# Patient Record
Sex: Female | Born: 2005 | Race: White | Hispanic: No | Marital: Single | Smoking: Never smoker
Health system: Southern US, Community
[De-identification: ages and names within clinical notes are randomized; demographics above are authoritative.]

## PROBLEM LIST (undated history)

## (undated) DIAGNOSIS — D649 Anemia, unspecified: Secondary | ICD-10-CM

## (undated) HISTORY — DX: Anemia, unspecified: D64.9

## (undated) HISTORY — PX: TYMPANOSTOMY TUBE PLACEMENT: SHX32

---
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
## 2024-01-30 ENCOUNTER — Encounter (HOSPITAL_BASED_OUTPATIENT_CLINIC_OR_DEPARTMENT_OTHER): Payer: Self-pay | Admitting: Emergency Medicine

## 2024-01-30 ENCOUNTER — Other Ambulatory Visit: Payer: Self-pay

## 2024-01-30 DIAGNOSIS — S4991XA Unspecified injury of right shoulder and upper arm, initial encounter: Secondary | ICD-10-CM | POA: Diagnosis present

## 2024-01-30 DIAGNOSIS — W228XXA Striking against or struck by other objects, initial encounter: Secondary | ICD-10-CM | POA: Insufficient documentation

## 2024-01-30 MED ORDER — ACETAMINOPHEN 325 MG PO TABS
650.0000 mg | ORAL_TABLET | Freq: Once | ORAL | Status: AC
  Administered 2024-01-30: 650 mg via ORAL
  Filled 2024-01-30: qty 2

## 2024-01-30 NOTE — ED Triage Notes (Signed)
  Patient comes in with R elbow and wrist pain that occurred about an hour ago after hitting arm on wall.  Patient was playing tik tok game and hit a wall full force with R arm.  No obvious deformity.  Pain 7/10, throbbing.

## 2024-01-31 ENCOUNTER — Emergency Department (HOSPITAL_BASED_OUTPATIENT_CLINIC_OR_DEPARTMENT_OTHER): Admitting: Radiology

## 2024-01-31 ENCOUNTER — Emergency Department (HOSPITAL_BASED_OUTPATIENT_CLINIC_OR_DEPARTMENT_OTHER)
Admission: EM | Admit: 2024-01-31 | Discharge: 2024-01-31 | Disposition: A | Discharge status: Home / Self Care | Attending: Emergency Medicine | Admitting: Emergency Medicine

## 2024-01-31 DIAGNOSIS — S4991XA Unspecified injury of right shoulder and upper arm, initial encounter: Secondary | ICD-10-CM

## 2024-01-31 NOTE — ED Provider Notes (Signed)
 [place] EMERGENCY DEPARTMENT AT [facility] Provider Note   CSN: [id number] Arrival date & time: 01/30/24  2259     History Chief Complaint  Patient presents with   Arm Injury    HPI Kathleen Ford is a 18 y.o. female presenting for right arm pain.  She was spinning in a circle with blindfold on as part of a game and her arms leaned into the corner of the wall.  Impact was just distal to the elbow.  Substantial pain at the site.  Difficulty moving her elbow.  Neurovascularly intact.  Otherwise healthy.   Patient's recorded medical, surgical, social, medication list and allergies were reviewed in the Snapshot window as part of the initial history.   Review of Systems   Review of Systems  Constitutional:  Negative for chills and fever.  HENT:  Negative for ear pain and sore throat.   Eyes:  Negative for pain and visual disturbance.  Respiratory:  Negative for cough and shortness of breath.   Cardiovascular:  Negative for chest pain and palpitations.  Gastrointestinal:  Negative for abdominal pain and vomiting.  Genitourinary:  Negative for dysuria and hematuria.  Musculoskeletal:  Negative for arthralgias and back pain.  Skin:  Negative for color change and rash.  Neurological:  Negative for seizures and syncope.  All other systems reviewed and are negative.   Physical Exam Updated Vital Signs BP 128/78   Pulse 96   Temp 98.4 F (36.9 C)   Resp 17   Ht 5' 4 (1.626 m)   Wt 63.5 kg   LMP 01/30/2024 (Approximate)   SpO2 98%   BMI 24.03 kg/m  Physical Exam Vitals and nursing note reviewed.  Constitutional:      General: She is not in acute distress.    Appearance: She is well-developed.  HENT:     Head: Normocephalic and atraumatic.  Eyes:     Conjunctiva/sclera: Conjunctivae normal.  Cardiovascular:     Rate and Rhythm: Normal rate and regular rhythm.     Heart sounds: No murmur heard. Pulmonary:     Effort: Pulmonary effort is normal. No  respiratory distress.     Breath sounds: Normal breath sounds.  Abdominal:     General: There is no distension.     Palpations: Abdomen is soft.     Tenderness: There is no abdominal tenderness. There is no right CVA tenderness or left CVA tenderness.  Musculoskeletal:        General: Swelling and tenderness present. Normal range of motion.     Cervical back: Neck supple.  Skin:    General: Skin is warm and dry.  Neurological:     General: No focal deficit present.     Mental Status: She is alert and oriented to person, place, and time. Mental status is at baseline.     Cranial Nerves: No cranial nerve deficit.      ED Course/ Medical Decision Making/ A&P    Procedures Procedures   Medications Ordered in ED Medications  acetaminophen  (TYLENOL ) tablet 650 mg (650 mg Oral Given 01/30/24 2332)    Medical Decision Making:   Right arm pain after blunt trauma.  Neurovascularly intact.  X-ray showed no fracture.  However pain is out of proportion with anticipated x-ray findings.  She does have soft tissue swelling.  May just be soft tissue damage but may be occult fracture.  Placed in sling for temporary immobilization and comfort I recommended patient to have repeat imaging done  within 72 hours at orthopedics for further diagnostic evaluation.  Considered full immobilization, however there is no posterior fat pad and patient is having substantial amounts of swelling that may be uncomfortable in a splint.  She understood instruction to keep sling on at all times until seen by orthopedics. Disposition:  I have considered need for hospitalization, however, considering all of the above, I believe this patient is stable for discharge at this time.  Patient/family educated about specific return precautions for given chief complaint and symptoms.  Patient/family educated about follow-up with PCP.     Patient/family expressed understanding of return precautions and need for follow-up. Patient  spoken to regarding all imaging and laboratory results and appropriate follow up for these results. All education provided in verbal form with additional information in written form. Time was allowed for answering of patient questions. Patient discharged.    Emergency Department Medication Summary:   Medications  acetaminophen  (TYLENOL ) tablet 650 mg (650 mg Oral Given 01/30/24 2332)         Clinical Impression:  1. Injury of right upper extremity, initial encounter      Discharge   Final Clinical Impression(s) / ED Diagnoses Final diagnoses:  Injury of right upper extremity, initial encounter    Rx / DC Orders ED Discharge Orders     None         Jerral Meth, MD 01/31/24 [phone]
